# Patient Record
Sex: Male | Born: 1970 | Race: White | Hispanic: No | Marital: Married
Health system: Southern US, Community
[De-identification: ages and names within clinical notes are randomized; demographics above are authoritative.]

---

## 2011-05-12 ENCOUNTER — Other Ambulatory Visit: Payer: Self-pay | Admitting: General Practice

## 2011-05-12 ENCOUNTER — Ambulatory Visit
Admission: RE | Admit: 2011-05-12 | Discharge: 2011-05-12 | Disposition: A | Discharge status: Home / Self Care | Payer: BC Managed Care – PPO | Source: Ambulatory Visit | Attending: General Practice | Admitting: General Practice

## 2011-05-12 DIAGNOSIS — M549 Dorsalgia, unspecified: Secondary | ICD-10-CM

## 2014-11-07 ENCOUNTER — Emergency Department: Payer: Self-pay | Admitting: Emergency Medicine

## 2014-11-10 ENCOUNTER — Emergency Department: Payer: Self-pay | Admitting: Emergency Medicine

## 2018-08-24 ENCOUNTER — Emergency Department
Admission: EM | Admit: 2018-08-24 | Discharge: 2018-08-24 | Disposition: A | Discharge status: Home / Self Care | Payer: BLUE CROSS/BLUE SHIELD | Attending: Emergency Medicine | Admitting: Emergency Medicine

## 2018-08-24 ENCOUNTER — Emergency Department: Payer: BLUE CROSS/BLUE SHIELD

## 2018-08-24 ENCOUNTER — Encounter: Payer: Self-pay | Admitting: Emergency Medicine

## 2018-08-24 DIAGNOSIS — Y999 Unspecified external cause status: Secondary | ICD-10-CM | POA: Diagnosis not present

## 2018-08-24 DIAGNOSIS — S0990XA Unspecified injury of head, initial encounter: Secondary | ICD-10-CM | POA: Diagnosis present

## 2018-08-24 DIAGNOSIS — Y9389 Activity, other specified: Secondary | ICD-10-CM | POA: Insufficient documentation

## 2018-08-24 DIAGNOSIS — W208XXA Other cause of strike by thrown, projected or falling object, initial encounter: Secondary | ICD-10-CM | POA: Diagnosis not present

## 2018-08-24 DIAGNOSIS — Y92015 Private garage of single-family (private) house as the place of occurrence of the external cause: Secondary | ICD-10-CM | POA: Insufficient documentation

## 2018-08-24 DIAGNOSIS — S0181XA Laceration without foreign body of other part of head, initial encounter: Secondary | ICD-10-CM

## 2018-08-24 DIAGNOSIS — Z23 Encounter for immunization: Secondary | ICD-10-CM | POA: Insufficient documentation

## 2018-08-24 MED ORDER — LIDOCAINE HCL (PF) 1 % IJ SOLN
10.0000 mL | Freq: Once | INTRAMUSCULAR | Status: AC
  Administered 2018-08-24: 10 mL
  Filled 2018-08-24: qty 10

## 2018-08-24 MED ORDER — PROMETHAZINE HCL 25 MG/ML IJ SOLN
25.0000 mg | Freq: Once | INTRAMUSCULAR | Status: AC
  Administered 2018-08-24: 25 mg via INTRAVENOUS
  Filled 2018-08-24: qty 1

## 2018-08-24 MED ORDER — KETOROLAC TROMETHAMINE 30 MG/ML IJ SOLN
30.0000 mg | Freq: Once | INTRAMUSCULAR | Status: AC
  Administered 2018-08-24: 30 mg via INTRAVENOUS
  Filled 2018-08-24: qty 1

## 2018-08-24 MED ORDER — DIPHENHYDRAMINE HCL 50 MG/ML IJ SOLN
50.0000 mg | Freq: Once | INTRAMUSCULAR | Status: AC
  Administered 2018-08-24: 50 mg via INTRAVENOUS
  Filled 2018-08-24: qty 1

## 2018-08-24 MED ORDER — TETANUS-DIPHTH-ACELL PERTUSSIS 5-2.5-18.5 LF-MCG/0.5 IM SUSP
0.5000 mL | Freq: Once | INTRAMUSCULAR | Status: AC
  Administered 2018-08-24: 0.5 mL via INTRAMUSCULAR
  Filled 2018-08-24: qty 0.5

## 2018-08-24 NOTE — ED Provider Notes (Signed)
[facility] Emergency Department Provider Note  ____________________________________________  Time seen: Approximately 4:50 PM  I have reviewed the triage vital signs and the nursing notes.   HISTORY  Chief Complaint Head Injury    HPI Jerome Sanders is a 47 y.o. male who presents the emergency department complaining of head injury.  Patient was working in his garage, on the garage door fell striking him in the head.  Is not him down, but did not lose consciousness.  Patient sustained a laceration that bled to the left side of the head.  EMS was called.  Patient was complaining of significant headache and fentanyl was administered by EMS in route to the emergency department.  Patient reports that after medication, pain, headache drastically improved.  Patient reports some mild global headache at this time.  No dizziness, visual changes, neck pain at this time.  No chest pain, shortness of breath.  No chronic medical problems.  No chronic medications.    History reviewed. No pertinent past medical history.  There are no active problems to display for this patient.   History reviewed. No pertinent surgical history.  Prior to Admission medications   Not on File    Allergies Patient has no allergy information on record.  No family history on file.  Social History Social History   Tobacco Use  . Smoking status: Not on file  Substance Use Topics  . Alcohol use: Not on file  . Drug use: Not on file     Review of Systems  Constitutional: No fever/chills Eyes: No visual changes.  Cardiovascular: no chest pain. Respiratory: no cough. No SOB. Gastrointestinal: No abdominal pain.  No nausea, no vomiting.   Musculoskeletal: Negative for musculoskeletal pain. Skin: Positive for laceration to the left scalp. Neurological: Positive for head trauma with headache but denies focal weakness or numbness. 10-point ROS otherwise  negative.  ____________________________________________   PHYSICAL EXAM:  VITAL SIGNS: ED Triage Vitals  Enc Vitals Group     BP 08/24/18 1607 (!) 152/92     Pulse Rate 08/24/18 1607 70     Resp 08/24/18 1607 18     Temp 08/24/18 1607 98.5 F (36.9 C)     Temp Source 08/24/18 1607 Oral     SpO2 08/24/18 1607 98 %     Weight 08/24/18 1608 180 lb (81.6 kg)     Height 08/24/18 1608 5' 10.5" (1.791 m)     Head Circumference --      Peak Flow --      Pain Score 08/24/18 1608 4     Pain Loc --      Pain Edu? --      Excl. in GC? --      Constitutional: Alert and oriented. Well appearing and in no acute distress. Eyes: Conjunctivae are normal. PERRL. EOMI. Head: A 6 cm laceration noted to the left frontal scalp.  No bleeding at this time.  No foreign body.  Edges are gaped open.  Smooth in nature.  No significant surrounding erythema, edema, ecchymosis.  Area is tender to palpation.  No palpable abnormality or crepitus.  No battle signs, raccoon eyes, serosanguineous fluid drainage from the ears or nares. ENT:      Ears:       Nose: No congestion/rhinnorhea.      Mouth/Throat: Mucous membranes are moist.  Neck: No stridor.  No cervical spine tenderness to palpation.  Cardiovascular: Normal rate, regular rhythm. Normal S1 and S2.  Good peripheral  circulation. Respiratory: Normal respiratory effort without tachypnea or retractions. Lungs CTAB. Good air entry to the bases with no decreased or absent breath sounds. Musculoskeletal: Full range of motion to all extremities. No gross deformities appreciated. Neurologic:  Normal speech and language. No gross focal neurologic deficits are appreciated.  Cranial nerves II through XII grossly intact.  Negative Romberg's and pronator drift. Skin:  Skin is warm, dry and intact. No rash noted. Psychiatric: Mood and affect are normal. Speech and behavior are normal. Patient exhibits appropriate insight and  judgement.   ____________________________________________   LABS (all labs ordered are listed, but only abnormal results are displayed)  Labs Reviewed - No data to display ____________________________________________  EKG   ____________________________________________  RADIOLOGY I personally viewed and evaluated these images as part of my medical decision making, as well as reviewing the written report by the radiologist.  I concur with radiologist finding of no acute osseous or intracranial abnormality to the head or cervical spine.  Ct Head Wo Contrast  Result Date: 08/24/2018 CLINICAL DATA:  Hit on head by garage door headache EXAM: CT HEAD WITHOUT CONTRAST CT CERVICAL SPINE WITHOUT CONTRAST TECHNIQUE: Multidetector CT imaging of the head and cervical spine was performed following the standard protocol without intravenous contrast. Multiplanar CT image reconstructions of the cervical spine were also generated. COMPARISON:  11/07/2014 radiograph FINDINGS: CT HEAD FINDINGS Brain: No evidence of acute infarction, hemorrhage, hydrocephalus, extra-axial collection or mass lesion/mass effect. Vascular: No hyperdense vessel or unexpected calcification. Skull: Normal. Negative for fracture or focal lesion. Sinuses/Orbits: No acute finding. Other: Small left frontal scalp laceration. CT CERVICAL SPINE FINDINGS Alignment: Normal. Skull base and vertebrae: No acute fracture. No primary bone lesion or focal pathologic process. Soft tissues and spinal canal: No prevertebral fluid or swelling. No visible canal hematoma. Disc levels:  Mild degenerative change at C5-C6 and C6-C7 Upper chest: Heterogenous low-density thyroid gland with calcifications. Other: None IMPRESSION: 1. Negative non contrasted CT appearance of the brain 2. No acute osseous abnormality of the cervical spine 3. Diffuse hypodensity of the thyroid gland with scattered parenchymal calcifications and hyperdense foci, recommend correlation  for any history of diffuse thyroid disease. Electronically Signed   By: Jasmine Pang M.D.   On: 08/24/2018 17:29   Ct Cervical Spine Wo Contrast  Result Date: 08/24/2018 CLINICAL DATA:  Hit on head by garage door headache EXAM: CT HEAD WITHOUT CONTRAST CT CERVICAL SPINE WITHOUT CONTRAST TECHNIQUE: Multidetector CT imaging of the head and cervical spine was performed following the standard protocol without intravenous contrast. Multiplanar CT image reconstructions of the cervical spine were also generated. COMPARISON:  11/07/2014 radiograph FINDINGS: CT HEAD FINDINGS Brain: No evidence of acute infarction, hemorrhage, hydrocephalus, extra-axial collection or mass lesion/mass effect. Vascular: No hyperdense vessel or unexpected calcification. Skull: Normal. Negative for fracture or focal lesion. Sinuses/Orbits: No acute finding. Other: Small left frontal scalp laceration. CT CERVICAL SPINE FINDINGS Alignment: Normal. Skull base and vertebrae: No acute fracture. No primary bone lesion or focal pathologic process. Soft tissues and spinal canal: No prevertebral fluid or swelling. No visible canal hematoma. Disc levels:  Mild degenerative change at C5-C6 and C6-C7 Upper chest: Heterogenous low-density thyroid gland with calcifications. Other: None IMPRESSION: 1. Negative non contrasted CT appearance of the brain 2. No acute osseous abnormality of the cervical spine 3. Diffuse hypodensity of the thyroid gland with scattered parenchymal calcifications and hyperdense foci, recommend correlation for any history of diffuse thyroid disease. Electronically Signed   By: Adrian Prows.D.  On: 08/24/2018 17:29    ____________________________________________    PROCEDURES  Procedure(s) performed:    Marland KitchenMarland KitchenLaceration Repair Date/Time: 08/24/2018 6:37 PM Performed by: Racheal Patches, PA-C Authorized by: Racheal Patches, PA-C   Consent:    Consent obtained:  Verbal   Consent given by:  Patient    Risks discussed:  Pain Anesthesia (see MAR for exact dosages):    Anesthesia method:  Local infiltration   Local anesthetic:  Lidocaine 1% w/o epi Laceration details:    Location:  Scalp   Scalp location:  Frontal   Length (cm):  5 Repair type:    Repair type:  Simple Pre-procedure details:    Preparation:  Patient was prepped and draped in usual sterile fashion Exploration:    Hemostasis achieved with:  Direct pressure   Wound exploration: wound explored through full range of motion and entire depth of wound probed and visualized     Wound extent: no foreign bodies/material noted, no muscle damage noted, no nerve damage noted, no tendon damage noted, no underlying fracture noted and no vascular damage noted     Contaminated: no   Treatment:    Area cleansed with:  Betadine   Amount of cleaning:  Standard   Irrigation solution:  Sterile saline   Irrigation volume:  500 ml   Irrigation method:  Syringe Skin repair:    Repair method:  Sutures   Suture size:  5-0   Suture material:  Nylon   Suture technique:  Simple interrupted   Number of sutures:  7 Approximation:    Approximation:  Close Post-procedure details:    Dressing:  Open (no dressing)   Patient tolerance of procedure:  Tolerated well, no immediate complications      Medications  lidocaine (PF) (XYLOCAINE) 1 % injection 10 mL (has no administration in time range)  ketorolac (TORADOL) 30 MG/ML injection 30 mg (has no administration in time range)  diphenhydrAMINE (BENADRYL) injection 50 mg (has no administration in time range)  promethazine (PHENERGAN) injection 25 mg (has no administration in time range)  Tdap (BOOSTRIX) injection 0.5 mL (has no administration in time range)     ____________________________________________   INITIAL IMPRESSION / ASSESSMENT AND PLAN / ED COURSE  Pertinent labs & imaging results that were available during my care of the patient were reviewed by me and considered in my  medical decision making (see chart for details).  Review of the [place] CSRS was performed in accordance of the [facility] prior to dispensing any controlled drugs.      Patient's diagnosis is consistent with head injury with forehead laceration.  Patient presented to the emergency department after sustaining an injury to his head.  Patient was struck in the head with a garage door.  No loss of consciousness.  Neuro exam was reassuring.  Given mechanism of injury, CT scans were ordered.  These returned without any indication of fracture or intracranial hemorrhage.  Laceration was sutured as described above.  Wound care instructions are provided to patient.  No indication at this time of concussion.  Postconcussive symptoms are also discussed with patient and his wife.  Patient is given migraine cocktail for symptomatic relief prior to discharge.  Tetanus shot is updated at this time.  Follow-up with primary care in 1 week for suture removal or sooner if needed. Patient is given ED precautions to return to the ED for any worsening or new symptoms.     ____________________________________________  FINAL CLINICAL IMPRESSION(S) / ED DIAGNOSES  Final diagnoses:  Injury of head, initial encounter  Laceration of forehead, initial encounter      NEW MEDICATIONS STARTED DURING THIS VISIT:  ED Discharge Orders    None          This chart was dictated using voice recognition software/Dragon. Despite best efforts to proofread, errors can occur which can change the meaning. Any change was purely unintentional.    Racheal Patches, PA-C 08/24/18 1845    Rockne Menghini, MD 08/25/18 [phone]

## 2018-08-24 NOTE — ED Notes (Signed)
Iv 20 angio removed l ac site  Patent

## 2018-08-24 NOTE — ED Triage Notes (Signed)
Pt arrived via ems from home. Pt states he was opening the garage door when it came back down and hit him in the head. No loc. Pt states he has a mild headache now. Pt was given of fentanyl in route. Pt arrives with bandaged head, a & o x 4.

## 2018-08-24 NOTE — ED Notes (Signed)
Patient transported to CT 

## 2020-05-12 IMAGING — CT CT CERVICAL SPINE W/O CM
4 of 7 series · 13 of 33 positions shown, 14 images · non-contrast
Comparison: 11/07/2014 radiograph

CLINICAL DATA: Hit on head by garage door headache

EXAM:
CT HEAD WITHOUT CONTRAST
CT CERVICAL SPINE WITHOUT CONTRAST
TECHNIQUE: Multidetector CT imaging of the head and cervical spine was
performed following the standard protocol without intravenous
contrast. Multiplanar CT image reconstructions of the cervical spine
were also generated.

[Series 7: c spine soft · axial · 0.29mm/px · z∈[-252,-144]mm · 4 of 91 slices shown]
[im 19/91  soft-tissue]
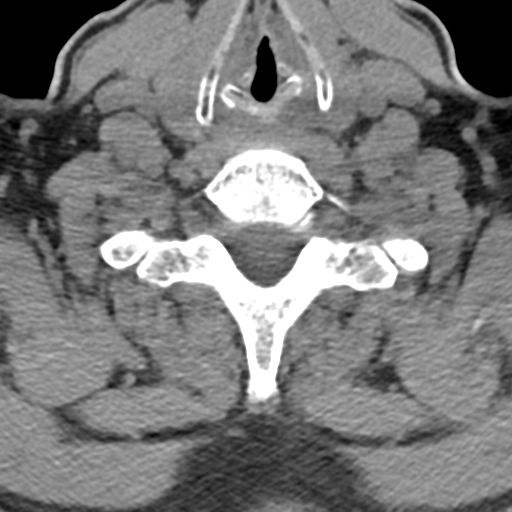
[im 37/91  soft-tissue]
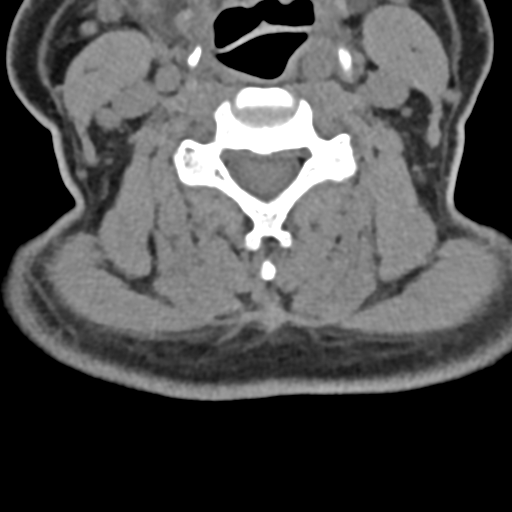
[im 55/91  soft-tissue]
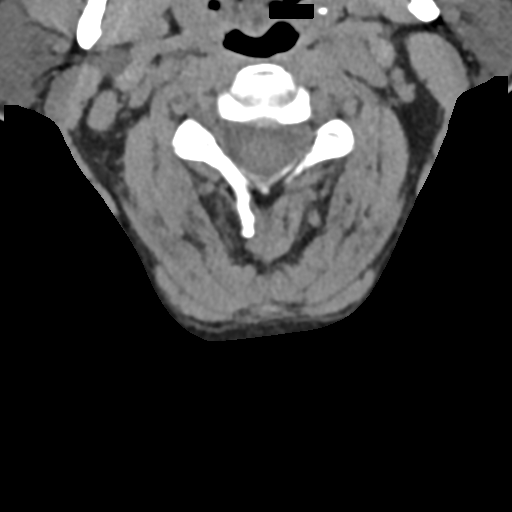
[im 73/91  soft-tissue]
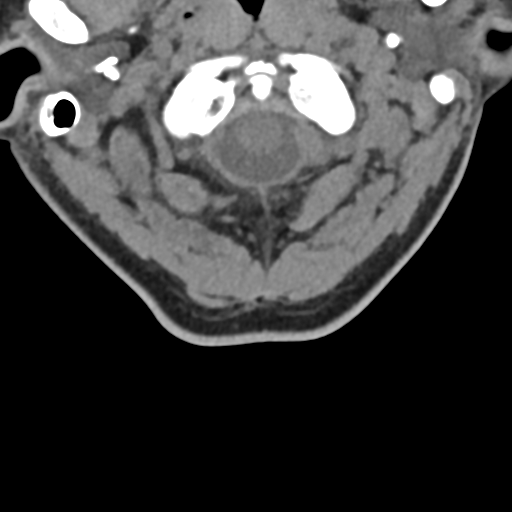

[Series 10: sagittal bone · sagittal · 0.23mm/px · 4 of 53 slices shown]
[im 11/53  bone]
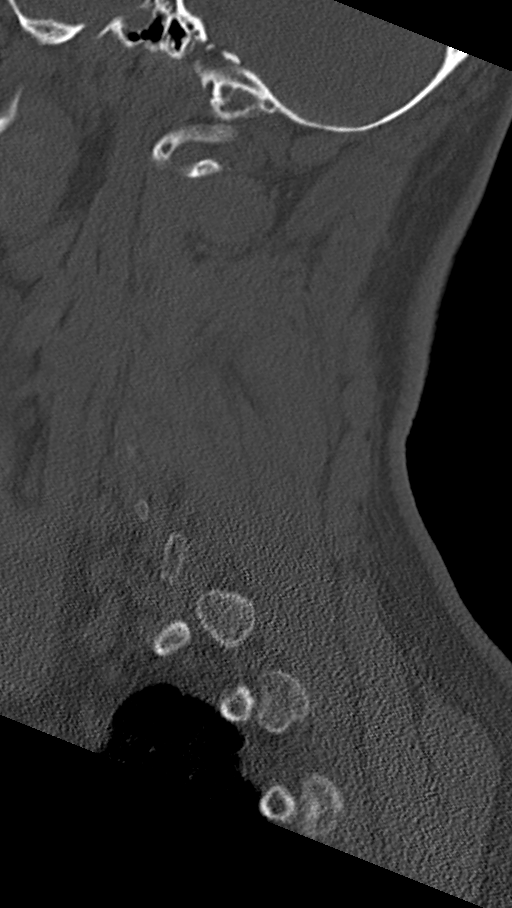
[im 21/53  bone]
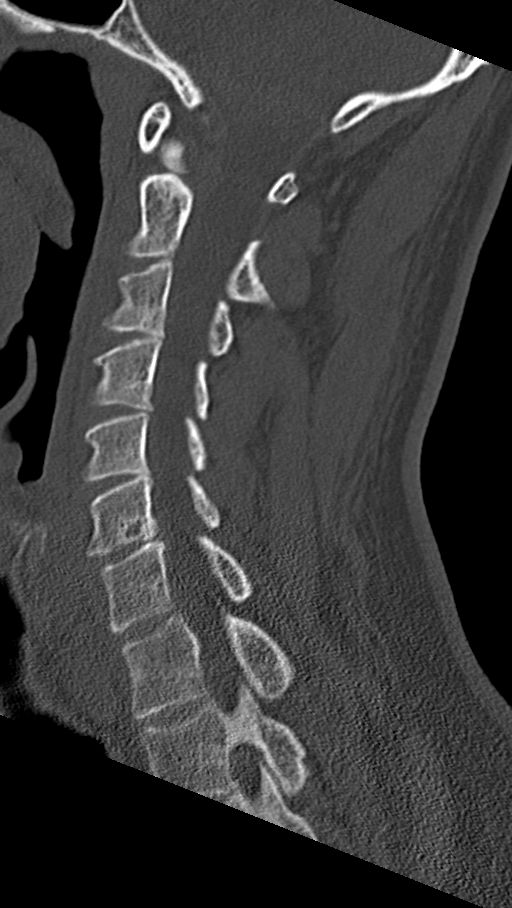
[im 32/53  bone]
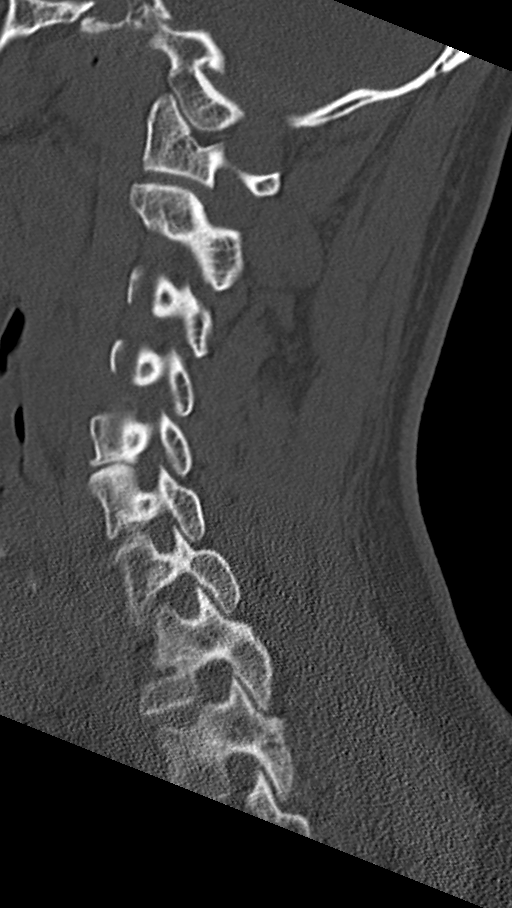
[im 42/53  bone]
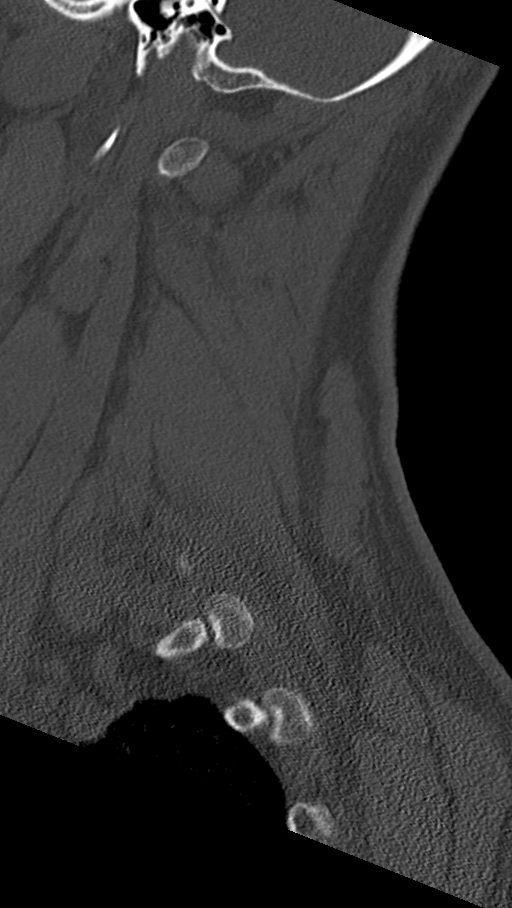

[Series 11: coronal bone · coronal · 0.23mm/px · 1 of 50 slices shown]
[im 25/50  bone]
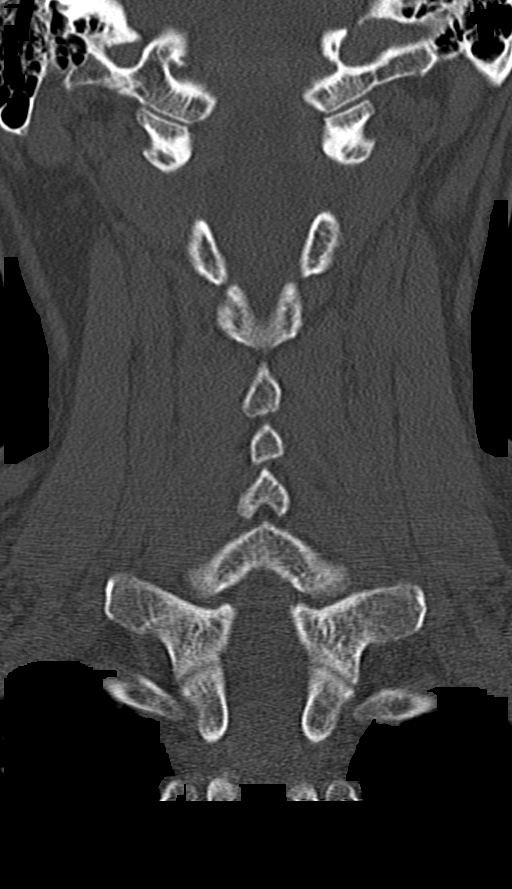

[Series 12: orthogonal bone · axial · 0.23mm/px · z∈[-282,-176]mm · 4 of 97 slices shown, 5 images]
[im 20/97  soft-tissue]
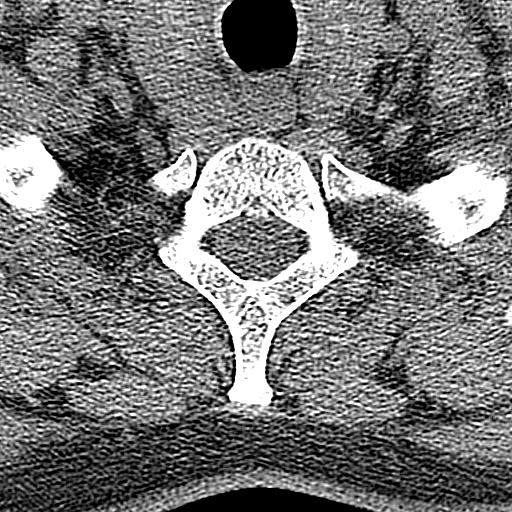
[im 20/97  bone]
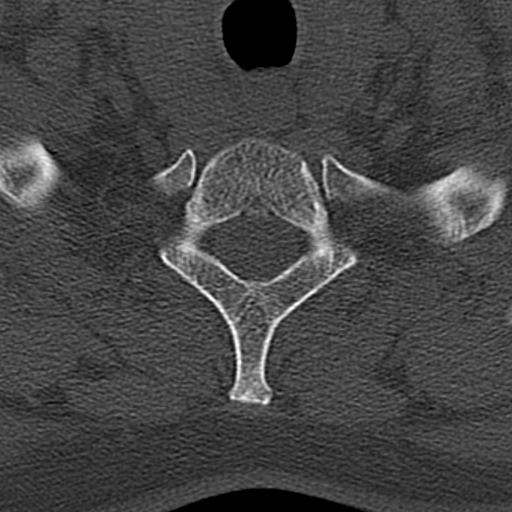
[im 39/97  bone]
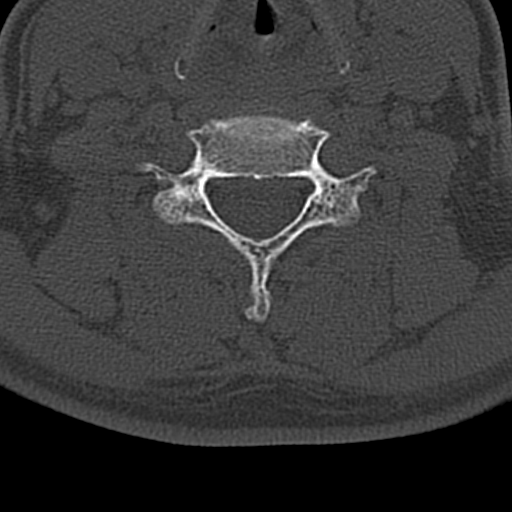
[im 58/97  bone]
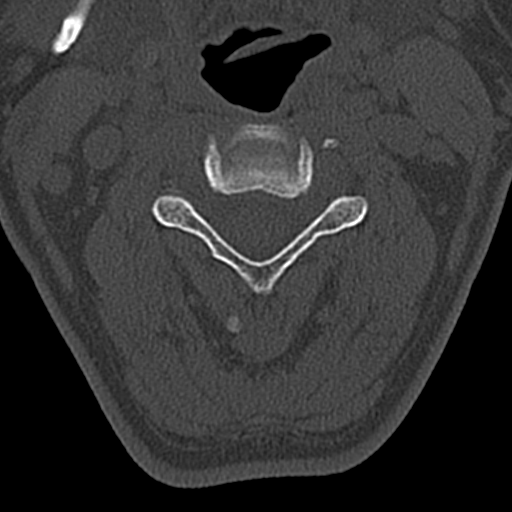
[im 77/97  bone]
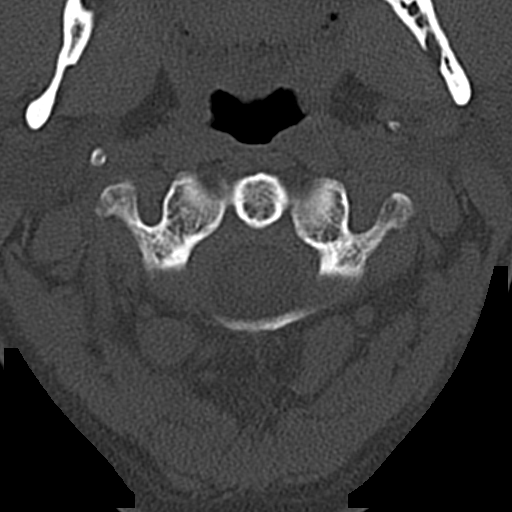

[13 of 33 positions shown; findings below may reference images not displayed]

FINDINGS: CT HEAD FINDINGS

Brain: No evidence of acute infarction, hemorrhage, hydrocephalus,
extra-axial collection or mass lesion/mass effect.

Vascular: No hyperdense vessel or unexpected calcification.

Skull: Normal. Negative for fracture or focal lesion.

Sinuses/Orbits: No acute finding.

Other: Small left frontal scalp laceration.

CT CERVICAL SPINE FINDINGS

Alignment: Normal.

Skull base and vertebrae: No acute fracture. No primary bone lesion
or focal pathologic process.

Soft tissues and spinal canal: No prevertebral fluid or swelling. No
visible canal hematoma.

Disc levels:  Mild degenerative change at C5-C6 and C6-C7

Upper chest: Heterogenous low-density thyroid gland with
calcifications.

Other: None
IMPRESSION: 1. Negative non contrasted CT appearance of the brain
2. No acute osseous abnormality of the cervical spine
3. Diffuse hypodensity of the thyroid gland with scattered
parenchymal calcifications and hyperdense foci, recommend
correlation for any history of diffuse thyroid disease.
